# Patient Record
Sex: Female | Born: 1980 | Race: White | Hispanic: No | Marital: Married | Smoking: Never smoker
Health system: Southern US, Community
[De-identification: ages and names within clinical notes are randomized; demographics above are authoritative.]

## PROBLEM LIST (undated history)

## (undated) DIAGNOSIS — I10 Essential (primary) hypertension: Secondary | ICD-10-CM

## (undated) DIAGNOSIS — E039 Hypothyroidism, unspecified: Secondary | ICD-10-CM

## (undated) DIAGNOSIS — F419 Anxiety disorder, unspecified: Secondary | ICD-10-CM

## (undated) HISTORY — PX: CHOLECYSTECTOMY: SHX55

## (undated) HISTORY — PX: ABDOMINAL SURGERY: SHX537

## (undated) HISTORY — PX: DILATION AND CURETTAGE OF UTERUS: SHX78

---

## 2011-07-05 ENCOUNTER — Encounter: Payer: Self-pay | Admitting: *Deleted

## 2011-07-05 ENCOUNTER — Emergency Department
Admission: EM | Admit: 2011-07-05 | Discharge: 2011-07-05 | Disposition: A | Discharge status: Home / Self Care | Payer: Managed Care, Other (non HMO) | Source: Home / Self Care | Attending: Emergency Medicine | Admitting: Emergency Medicine

## 2011-07-05 DIAGNOSIS — S40269A Insect bite (nonvenomous) of unspecified shoulder, initial encounter: Secondary | ICD-10-CM

## 2011-07-05 DIAGNOSIS — W57XXXA Bitten or stung by nonvenomous insect and other nonvenomous arthropods, initial encounter: Secondary | ICD-10-CM

## 2011-07-05 HISTORY — DX: Hypothyroidism, unspecified: E03.9

## 2011-07-05 MED ORDER — DOXYCYCLINE HYCLATE 100 MG PO CAPS: 100.0000 mg | ORAL_CAPSULE | Freq: Two times a day (BID) | ORAL | Status: AC

## 2011-07-05 NOTE — ED Provider Notes (Signed)
History     CSN: [id number]  Arrival date & time 07/05/11  1600   First MD Initiated Contact with Patient 07/05/11 1607      Chief Complaint  Patient presents with  . Rash    L aarm    (Consider location/radiation/quality/duration/timing/severity/associated sxs/prior treatment) HPI  This is a 31 year old white female who presents today with a red raised rash started about 5 days ago.  She states that she was at the park but does not recall getting into anything.  She denies pain or itching, however when she presses on the area it feels sensitive.  There is no sensitivity around the lesions or anywhere else in her arms.  There are 4 bumps with heads on them on her left upper arm.  She does not recall if she got bit by anything or not.  She's been using Polysporin and Neosporin.  She feels that the redness is actually decreasing/improving and she just wants to make sure it's ok.  Past Medical History  Diagnosis Date  . Hypothyroid     Past Surgical History  Procedure Date  . Cesarean section     2  . Dilation and curettage of uterus     Family History  Problem Relation Age of Onset  . Multiple sclerosis Mother   . Hypertension Father     History  Substance Use Topics  . Smoking status: Never Smoker   . Smokeless tobacco: Not on file  . Alcohol Use: No    OB History    Grav Para Term Preterm Abortions TAB SAB Ect Mult Living                  Review of Systems  All other systems reviewed and are negative.    Allergies  Relpax and Sulfa antibiotics  Home Medications   Current Outpatient Rx  Name Route Sig Dispense Refill  . LEVOTHYROXINE SODIUM 50 MCG PO TABS Oral Take 50 mcg by mouth daily.    Marland Kitchen PRENATAL 27-0.8 MG PO TABS Oral Take 1 tablet by mouth daily.    Marland Kitchen DOXYCYCLINE HYCLATE 100 MG PO CAPS Oral Take 1 capsule (100 mg total) by mouth 2 (two) times daily. 14 capsule 0    BP 148/98  Pulse 82  Temp(Src) 98.6 F (37 C) (Oral)  Resp 16  Ht 5\' 5"   (1.651 m)  Wt 247 lb (112.038 kg)  BMI 41.10 kg/m2  SpO2 100%  Physical Exam  Nursing note and vitals reviewed. Constitutional: She is oriented to person, place, and time. She appears well-developed and well-nourished.  HENT:  Head: Normocephalic and atraumatic.  Eyes: No scleral icterus.  Neck: Neck supple.  Cardiovascular: Regular rhythm and normal heart sounds.   Pulmonary/Chest: Effort normal and breath sounds normal. No respiratory distress.  Musculoskeletal:       Arms:      There are 4 small half centimeter areas of erythema with central popped pustular heads.  There is mild subcutaneous inflammation, but no induration or fluctuance.  Minimal tenderness to palpation.  There is no hyperesthesia around that area for any rales in that dermatome which would be C5.  Neurological: She is alert and oriented to person, place, and time.  Skin: Skin is warm and dry.  Psychiatric: She has a normal mood and affect. Her speech is normal.    ED Course  Procedures (including critical care time)  Labs Reviewed - No data to display No results found.   1. Bug bite  MDM  Differential diagnosis includes spider bites, shingles, or other mild cellulitis.  She is not experiencing any skin hypersensitivity or spreading of the rash so I do not believe that is shingles.  Looks to be 4 small bites, perhaps from 8 AM to or spider.  I gave her prescription for doxycycline but do not believe that she needs at this point.  I told her to hold it and use cortisone cream to the area.  If she is getting worse, she can start antibiotics.  If she is getting much worse in the rash is spreading in a band form, then she will call back and we'll need to treat her for shingles.  Marlaine Hind, MD 07/05/11 [phone]

## 2011-07-05 NOTE — ED Notes (Signed)
Pt developed a red raised rash 6 days ago.  Denies pain and itching.  There are 4 mumps with heads on L upper arm.

## 2013-08-03 ENCOUNTER — Encounter: Payer: Self-pay | Admitting: Emergency Medicine

## 2013-08-03 ENCOUNTER — Emergency Department
Admission: EM | Admit: 2013-08-03 | Discharge: 2013-08-03 | Disposition: A | Discharge status: Home / Self Care | Payer: Managed Care, Other (non HMO) | Source: Home / Self Care | Attending: Emergency Medicine | Admitting: Emergency Medicine

## 2013-08-03 DIAGNOSIS — J039 Acute tonsillitis, unspecified: Secondary | ICD-10-CM

## 2013-08-03 LAB — POCT RAPID STREP A (OFFICE)
Rapid Strep A Screen: NEGATIVE
Rapid Strep A Screen: NEGATIVE

## 2013-08-03 MED ORDER — AMOXICILLIN-POT CLAVULANATE 875-125 MG PO TABS: 1.0000 | ORAL_TABLET | Freq: Two times a day (BID) | ORAL | Status: DC

## 2013-08-03 MED ORDER — CEFTRIAXONE SODIUM 1 G IJ SOLR
1.0000 g | INTRAMUSCULAR | Status: AC
  Administered 2013-08-03: 1 g via INTRAMUSCULAR

## 2013-08-03 NOTE — ED Provider Notes (Addendum)
CSN: [id number]     Arrival date & time 08/03/13  1816 History   None    Chief Complaint  Patient presents with  . Sore Throat  Reports severe sore throat x 4 days. Was seen in [facility] yesterday and had rapid strep negative.  HPI SORE THROAT     Severity: Severe Tried OTC meds without significant relief.  Symptoms:  + Low-grade Fever  + Severe Swollen neck glands, right greater than left No Recent Strep Exposure     No Myalgias + Headache No Rash  No Discolored Nasal Mucus No Allergy symptoms No sinus pain/pressure No itchy/red eyes + R earache. No drainage  No Drooling No Trismus  No Nausea No Vomiting No Abdominal pain No Diarrhea No Reflux symptoms  No Cough No Breathing Difficulty No Shortness of Breath No pleuritic pain No Wheezing No Hemoptysis   Past Medical History  Diagnosis Date  . Hypothyroid    Past Surgical History  Procedure Laterality Date  . Cesarean section      2  . Dilation and curettage of uterus     Family History  Problem Relation Age of Onset  . Multiple sclerosis Mother   . Hypertension Father    History  Substance Use Topics  . Smoking status: Never Smoker   . Smokeless tobacco: Not on file  . Alcohol Use: No   OB History   Grav Para Term Preterm Abortions TAB SAB Ect Mult Living                 Review of Systems  All other systems reviewed and are negative.   Allergies  Relpax and Sulfa antibiotics  Home Medications   Prior to Admission medications   Medication Sig Start Date End Date Taking? Authorizing Provider  phentermine 37.5 MG capsule Take 37.5 mg by mouth every morning.   Yes Historical Provider, MD  amoxicillin-clavulanate (AUGMENTIN) 875-125 MG per tablet Take 1 tablet by mouth 2 (two) times daily. For 10 days. Take with food. 08/03/13   Lajean Manesavid Massey, MD  levothyroxine (SYNTHROID, LEVOTHROID) 50 MCG tablet Take 50 mcg by mouth daily.    Historical Provider, MD  Prenatal Vit-Fe Fumarate-FA  (MULTIVITAMIN-PRENATAL) 27-0.8 MG TABS Take 1 tablet by mouth daily.    Historical Provider, MD   Pulse 98  Temp(Src) 97.9 F (36.6 C) (Oral)  Ht 5\' 5"  (1.651 m)  Wt 233 lb (105.688 kg)  BMI 38.77 kg/m2  SpO2 100% Physical Exam  Nursing note and vitals reviewed. Constitutional: She is oriented to person, place, and time. She appears well-developed and well-nourished.  Non-toxic appearance. She appears ill. No distress.  HENT:  Head: Normocephalic and atraumatic.  Right Ear: External ear and ear canal normal. No drainage. Tympanic membrane is erythematous (Minimal injection). Tympanic membrane is not perforated and not bulging.  Left Ear: Tympanic membrane, external ear and ear canal normal. No drainage. Tympanic membrane is not perforated, not erythematous and not bulging.  Nose: Nose normal. Right sinus exhibits no maxillary sinus tenderness and no frontal sinus tenderness. Left sinus exhibits no maxillary sinus tenderness and no frontal sinus tenderness.  Mouth/Throat: Uvula is midline and mucous membranes are normal. No oral lesions. Posterior oropharyngeal erythema present. No oropharyngeal exudate or tonsillar abscesses.  + 4 Tonsillar enlargement and redness. Whitish exudate bilaterally  Airway intact.  Eyes: Conjunctivae are normal. No scleral icterus.  Neck: Neck supple.  Cardiovascular: Normal rate, regular rhythm and normal heart sounds.   No murmur heard.  Pulmonary/Chest: Effort normal and breath sounds normal. No stridor. No respiratory distress. She has no wheezes. She has no rhonchi. She has no rales.  Abdominal: Soft. She exhibits no mass. There is no hepatosplenomegaly. There is no tenderness.  Lymphadenopathy:    She has cervical adenopathy.       Right cervical: Superficial cervical adenopathy present. No deep cervical and no posterior cervical adenopathy present.      Left cervical: Superficial cervical adenopathy present. No deep cervical and no posterior cervical  adenopathy present.  Neurological: She is alert and oriented to person, place, and time.  Skin: Skin is warm. No rash noted.  Psychiatric: She has a normal mood and affect.    ED Course  Procedures (including critical care time) Labs Review Labs Reviewed  POCT RAPID STREP A (OFFICE)   Results for orders placed during the hospital encounter of 08/03/13  POCT RAPID STREP A (OFFICE)      Result Value Ref Range   Rapid Strep A Screen Negative  Negative     Imaging Review No results found.   MDM   1. Tonsillitis with exudate    no evidence of peritonsillar abscess. Rapid strep test here is negative.  Treatment options discussed, as well as risks, benefits, alternatives. Patient voiced understanding and agreement with the following plans: Rocephin 1 g IM stat. Augmentin 875 twice a day x10 days. Sendoff strep culture. She declined any other testing. Discussed that it's much less likely mononucleus was, as she is over 30.--She declined mono testing today. Other symptomatic care discussed.  Follow-up with your primary care doctor in 3-5 days if not improving, or sooner if symptoms become worse. Precautions discussed. Red flags discussed.--Go to emergency room immediately if any red flags. Questions invited and answered. Patient voiced understanding and agreement.      Lajean Manesavid Massey, MD 08/03/13 1857  Lajean Manesavid Massey, MD 08/03/13 [phone]

## 2013-08-03 NOTE — ED Notes (Signed)
Reports severely sore throat x 4 days. Was seen in [facility] yesterday and had rapid strep negative.

## 2013-08-04 LAB — STREP A DNA PROBE: GASP: NEGATIVE

## 2013-08-05 ENCOUNTER — Telehealth: Payer: Self-pay | Admitting: Emergency Medicine

## 2013-08-19 ENCOUNTER — Emergency Department
Admission: EM | Admit: 2013-08-19 | Discharge: 2013-08-19 | Disposition: A | Discharge status: Home / Self Care | Payer: Managed Care, Other (non HMO) | Source: Home / Self Care | Attending: Emergency Medicine | Admitting: Emergency Medicine

## 2013-08-19 ENCOUNTER — Encounter: Payer: Self-pay | Admitting: Emergency Medicine

## 2013-08-19 DIAGNOSIS — J028 Acute pharyngitis due to other specified organisms: Secondary | ICD-10-CM

## 2013-08-19 DIAGNOSIS — J029 Acute pharyngitis, unspecified: Secondary | ICD-10-CM

## 2013-08-19 MED ORDER — CLARITHROMYCIN 500 MG PO TABS: 500.0000 mg | ORAL_TABLET | Freq: Two times a day (BID) | ORAL | Status: DC

## 2013-08-19 NOTE — ED Provider Notes (Signed)
CSN: [id number]     Arrival date & time 08/19/13  1729 History   First MD Initiated Contact with Patient 08/19/13 1740     Chief Complaint  Patient presents with  . Sore Throat   (Consider location/radiation/quality/duration/timing/severity/associated sxs/prior Treatment) HPI Chloe Liu is a 33 y.o. female who complains of onset of cold symptoms for a few days.  The symptoms are constant and mild-moderate in severity.  See previous note by Dr. Georgina Pillion earlier this month, however she was recently diagnosed with tonsillitis and put on 10 days of Augmentin.  Negative strep test and negative throat culture.  She improved on the antibiotics and then worsened recently again.  She didn't want to wait until she got as bad as she was before.  Has not eaten any medications or modalities for this.  Did change her toothbrush after the last course of antibiotics. + sore throat No cough No pleuritic pain No wheezing No nasal congestion No post-nasal drainage No sinus pain/pressure No chest congestion No itchy/red eyes No earache No hemoptysis No SOB No chills/sweats No fever No nausea No vomiting No abdominal pain No diarrhea No skin rashes No fatigue No myalgias No headache    Past Medical History  Diagnosis Date  . Hypothyroid    Past Surgical History  Procedure Laterality Date  . Cesarean section      2  . Dilation and curettage of uterus     Family History  Problem Relation Age of Onset  . Multiple sclerosis Mother   . Hypertension Father    History  Substance Use Topics  . Smoking status: Never Smoker   . Smokeless tobacco: Not on file  . Alcohol Use: No   OB History   Grav Para Term Preterm Abortions TAB SAB Ect Mult Living                 Review of Systems  All other systems reviewed and are negative.   Allergies  Relpax and Sulfa antibiotics  Home Medications   Prior to Admission medications   Medication Sig Start Date End Date Taking? Authorizing Provider   amoxicillin-clavulanate (AUGMENTIN) 875-125 MG per tablet Take 1 tablet by mouth 2 (two) times daily. For 10 days. Take with food. 08/03/13   Lajean Manes, MD  clarithromycin (BIAXIN) 500 MG tablet Take 1 tablet (500 mg total) by mouth 2 (two) times daily. 08/19/13   Marlaine Hind, MD  levothyroxine (SYNTHROID, LEVOTHROID) 50 MCG tablet Take 50 mcg by mouth daily.    Historical Provider, MD  phentermine 37.5 MG capsule Take 37.5 mg by mouth every morning.    Historical Provider, MD  Prenatal Vit-Fe Fumarate-FA (MULTIVITAMIN-PRENATAL) 27-0.8 MG TABS Take 1 tablet by mouth daily.    Historical Provider, MD   BP 139/90  Pulse 87  Temp(Src) 97.6 F (36.4 C) (Oral)  Resp 16  Ht 5\' 5"  (1.651 m)  Wt 231 lb (104.781 kg)  BMI 38.44 kg/m2  SpO2 98% Physical Exam  Nursing note and vitals reviewed. Constitutional: She is oriented to person, place, and time. Vital signs are normal. She appears well-developed and well-nourished.  HENT:  Head: Normocephalic and atraumatic.  Right Ear: Tympanic membrane, external ear and ear canal normal.  Left Ear: Tympanic membrane, external ear and ear canal normal.  Nose: Nose normal.  Mouth/Throat: Uvula is midline. Oropharyngeal exudate (minimal bilateral), posterior oropharyngeal edema (Tonsillar swelling 2+ bilateral) and posterior oropharyngeal erythema present.  Eyes: No scleral icterus.  Neck: Neck supple.  Cardiovascular:  Normal rate, regular rhythm and normal heart sounds.   Pulmonary/Chest: Effort normal and breath sounds normal. No respiratory distress. She has no decreased breath sounds. She has no wheezes.  Lymphadenopathy:       Head (right side): Tonsillar adenopathy present.       Head (left side): Tonsillar adenopathy present.  Neurological: She is alert and oriented to person, place, and time.  Skin: Skin is warm and dry.  Psychiatric: She has a normal mood and affect. Her speech is normal.    ED Course  Procedures (including critical  care time) Labs Review Labs Reviewed  STREP A DNA PROBE  POCT RAPID STREP A (OFFICE)    Imaging Review No results found.   MDM   1. Acute pharyngitis due to other specified organisms    1)  Take the prescribed antibiotic as instructed.  Was just on Augmentin recently so we'll switch to Biaxin for 10 days to treat for acute tonsillitis.  Rapid strep negative, culture pending. 2)  Use nasal saline solution (over the counter) at least 3 times a day. 3)  Use over the counter decongestants like Zyrtec-D every 12 hours as needed to help with congestion.  If you have hypertension, do not take medicines with sudafed.  4)  Can take tylenol every 6 hours or motrin every 8 hours for pain or fever. 5)  Follow up with your primary doctor if no improvement in 5-7 days, sooner if increasing pain, fever, or new symptoms.   If recurring symptoms after this, will likely need to be seen by ENT.  I educated her on making sure she changes her toothbrush, and washes anything that goes in her mouth to prevent self inoculation.    Marlaine HindJeffrey H Chaylee Ehrsam, MD 08/19/13 [phone]

## 2013-08-19 NOTE — ED Notes (Signed)
Reports recent onset sore throat. No known fever, no congestion or cough.

## 2013-08-20 LAB — STREP A DNA PROBE: GASP: NEGATIVE

## 2013-08-21 ENCOUNTER — Telehealth: Payer: Self-pay | Admitting: Emergency Medicine

## 2013-10-12 ENCOUNTER — Encounter: Payer: Self-pay | Admitting: Emergency Medicine

## 2013-10-12 ENCOUNTER — Emergency Department
Admission: EM | Admit: 2013-10-12 | Discharge: 2013-10-12 | Disposition: A | Discharge status: Home / Self Care | Payer: Managed Care, Other (non HMO) | Source: Home / Self Care | Attending: Emergency Medicine | Admitting: Emergency Medicine

## 2013-10-12 DIAGNOSIS — R05 Cough: Secondary | ICD-10-CM

## 2013-10-12 DIAGNOSIS — R059 Cough, unspecified: Secondary | ICD-10-CM

## 2013-10-12 MED ORDER — PREDNISONE (PAK) 10 MG PO TABS: ORAL_TABLET | Freq: Every day | ORAL | Status: DC

## 2013-10-12 MED ORDER — ALBUTEROL SULFATE HFA 108 (90 BASE) MCG/ACT IN AERS: 1.0000 | INHALATION_SPRAY | Freq: Four times a day (QID) | RESPIRATORY_TRACT | Status: AC | PRN

## 2013-10-12 NOTE — ED Provider Notes (Signed)
CSN: [id number]     Arrival date & time 10/12/13  1826 History   First MD Initiated Contact with Patient 10/12/13 1854     Chief Complaint  Patient presents with  . Cough   (Consider location/radiation/quality/duration/timing/severity/associated sxs/prior Treatment) HPI Chloe Liu is a 33 y.o. female who complains of onset of cold symptoms for 1 month.  The symptoms are constant and mild-moderate in severity.  Her main concern is the cough that she's been having for the last month.  She was treated while ago for tonsillitis but has had no problems since then.  Her daughter became sick with very similar symptoms.  An x-ray was done today for her daughter which was negative.  She was then given albuterol.  She does have reactive airway disease.  She has been also using some Tessalon which helps a little bit. No sore throat + cough No pleuritic pain No wheezing No nasal congestion No post-nasal drainage No sinus pain/pressure + chest congestion No itchy/red eyes No earache No hemoptysis No SOB No chills/sweats No fever No nausea No vomiting No abdominal pain No diarrhea No skin rashes No fatigue No myalgias No headache     Past Medical History  Diagnosis Date  . Hypothyroid    Past Surgical History  Procedure Laterality Date  . Cesarean section      2  . Dilation and curettage of uterus     Family History  Problem Relation Age of Onset  . Multiple sclerosis Mother   . Hypertension Father    History  Substance Use Topics  . Smoking status: Never Smoker   . Smokeless tobacco: Not on file  . Alcohol Use: No   OB History   Grav Para Term Preterm Abortions TAB SAB Ect Mult Living                 Review of Systems  All other systems reviewed and are negative.   Allergies  Relpax and Sulfa antibiotics  Home Medications   Prior to Admission medications   Medication Sig Start Date End Date Taking? Authorizing Provider  albuterol (PROVENTIL HFA;VENTOLIN HFA) 108  (90 BASE) MCG/ACT inhaler Inhale 1-2 puffs into the lungs every 6 (six) hours as needed for wheezing or shortness of breath. 10/12/13   Marlaine Hind, MD  amoxicillin-clavulanate (AUGMENTIN) 875-125 MG per tablet Take 1 tablet by mouth 2 (two) times daily. For 10 days. Take with food. 08/03/13   Lajean Manes, MD  clarithromycin (BIAXIN) 500 MG tablet Take 1 tablet (500 mg total) by mouth 2 (two) times daily. 08/19/13   Marlaine Hind, MD  levothyroxine (SYNTHROID, LEVOTHROID) 50 MCG tablet Take 50 mcg by mouth daily.    Historical Provider, MD  phentermine 37.5 MG capsule Take 37.5 mg by mouth every morning.    Historical Provider, MD  predniSONE (STERAPRED UNI-PAK) 10 MG tablet Take by mouth daily. 6 day pack, use as directed 10/12/13   Marlaine Hind, MD  Prenatal Vit-Fe Fumarate-FA (MULTIVITAMIN-PRENATAL) 27-0.8 MG TABS Take 1 tablet by mouth daily.    Historical Provider, MD   BP 155/85  Pulse 83  Temp(Src) 98.8 F (37.1 C) (Oral)  Resp 18  Ht  (1.651 m)  Wt 231 lb (104.781 kg)  BMI 38.44 kg/m2  SpO2 100% Physical Exam  Nursing note and vitals reviewed. Constitutional: She is oriented to person, place, and time. She appears well-developed and well-nourished.  Non-toxic appearance. She does not appear ill.  Actively coughing  HENT:  Head: Normocephalic and atraumatic.  Right Ear: Tympanic membrane, external ear and ear canal normal.  Left Ear: Tympanic membrane, external ear and ear canal normal.  Nose: Mucosal edema and rhinorrhea present.  Mouth/Throat: Posterior oropharyngeal erythema present. No oropharyngeal exudate or posterior oropharyngeal edema.  1+ tonsillar enlargement bilateral with some erythema  Eyes: No scleral icterus.  Neck: Neck supple.  Cardiovascular: Regular rhythm and normal heart sounds.   Pulmonary/Chest: Effort normal and breath sounds normal. No respiratory distress.  Neurological: She is alert and oriented to person, place, and time.   Skin: Skin is warm and dry.  Psychiatric: She has a normal mood and affect. Her speech is normal.    ED Course  Procedures (including critical care time) Labs Review Labs Reviewed - No data to display  Imaging Review No results found.   MDM   1. Cough    1)  likely reactive airway disease, viral.  We'll start her on prednisone 60 pack and albuterol inhaler.  No antibiotics at this time.  Can continue the Tessalon that she is already taking at home.  Call back for any problems or worsening of symptoms. 2)  Use nasal saline solution (over the counter) at least 3 times a day. 3)  Use over the counter decongestants like Zyrtec-D every 12 hours as needed to help with congestion.  If you have hypertension, do not take medicines with sudafed.  4)  Can take tylenol every 6 hours or motrin every 8 hours for pain or fever. 5)  Follow up with your primary doctor if no improvement in 5-7 days, sooner if increasing pain, fever, or new symptoms.     Marlaine Hind, MD 10/12/13 [phone]

## 2013-10-12 NOTE — ED Notes (Signed)
C/o cold symptoms x 1 month.

## 2015-01-30 ENCOUNTER — Emergency Department (INDEPENDENT_AMBULATORY_CARE_PROVIDER_SITE_OTHER)
Admission: EM | Admit: 2015-01-30 | Discharge: 2015-01-30 | Disposition: A | Discharge status: Home / Self Care | Payer: 59 | Source: Home / Self Care | Attending: Emergency Medicine | Admitting: Emergency Medicine

## 2015-01-30 ENCOUNTER — Encounter: Payer: Self-pay | Admitting: *Deleted

## 2015-01-30 DIAGNOSIS — J039 Acute tonsillitis, unspecified: Secondary | ICD-10-CM | POA: Diagnosis not present

## 2015-01-30 DIAGNOSIS — J069 Acute upper respiratory infection, unspecified: Secondary | ICD-10-CM | POA: Diagnosis not present

## 2015-01-30 HISTORY — DX: Anxiety disorder, unspecified: F41.9

## 2015-01-30 HISTORY — DX: Essential (primary) hypertension: I10

## 2015-01-30 LAB — POCT RAPID STREP A (OFFICE): Rapid Strep A Screen: NEGATIVE

## 2015-01-30 MED ORDER — CEFDINIR 300 MG PO CAPS: 300.0000 mg | ORAL_CAPSULE | Freq: Two times a day (BID) | ORAL | Status: DC

## 2015-01-30 NOTE — ED Notes (Signed)
Pt c/o sore throat and swollen tonsils x 2 days. Denies fever. She reports URI s/s intermittently x 1 mth.

## 2015-02-03 NOTE — ED Provider Notes (Signed)
CSN: [id number]     Arrival date & time 01/30/15  [id number] History   First MD Initiated Contact with Patient 01/30/15 0827     Chief Complaint  Patient presents with  . Sore Throat    HPI Pt c/o sore throat and swollen tonsils x 3 days, with low-grade fever. She reports URI s/s intermittently x 1 mth.         SORE THROAT Onset: 3 days    Severity: moderate-severe Tried OTC meds without significant relief.  Symptoms:  + Fever  + Swollen neck glands No Recent Strep Exposure     No Myalgias Mild, nonfocal Headache,Without radiation or neurologic symptoms No Rash  Occasional Discolored Nasal Mucus No Allergy symptoms Occasional sinus pain/pressure No itchy/red eyes No earache  No Drooling No Trismus  No Nausea No Vomiting No Abdominal pain No Diarrhea No Reflux symptoms  No Cough No Breathing Difficulty No Shortness of Breath No pleuritic pain No Wheezing No Hemoptysis    Past Medical History  Diagnosis Date  . Hypothyroid   . Anxiety   . Hypertension    Past Surgical History  Procedure Laterality Date  . Cesarean section      2  . Dilation and curettage of uterus     Family History  Problem Relation Age of Onset  . Multiple sclerosis Mother   . Depression Mother   . Hypertension Father    Social History  Substance Use Topics  . Smoking status: Never Smoker   . Smokeless tobacco: None  . Alcohol Use: No   OB History    No data available     Review of Systems  All other systems reviewed and are negative.   Allergies  Relpax and Sulfa antibiotics  Home Medications   Prior to Admission medications   Medication Sig Start Date End Date Taking? Authorizing Provider  metoprolol succinate (TOPROL-XL) 50 MG 24 hr tablet Take 75 mg by mouth daily. Take with or immediately following a meal.   Yes Historical Provider, MD  albuterol (PROVENTIL HFA;VENTOLIN HFA) 108 (90 BASE) MCG/ACT inhaler Inhale 1-2 puffs into the lungs every 6 (six) hours as  needed for wheezing or shortness of breath. 10/12/13   Marlaine HindJeffrey H Henderson, MD  cefdinir (OMNICEF) 300 MG capsule Take 1 capsule (300 mg total) by mouth 2 (two) times daily. X 10 days 01/30/15   Lajean Manesavid Massey, MD  levothyroxine (SYNTHROID, LEVOTHROID) 50 MCG tablet Take 50 mcg by mouth daily.    Historical Provider, MD   Meds Ordered and Administered this Visit  Medications - No data to display  BP 171/117 mmHg  Pulse 75  Temp(Src) 97.9 F (36.6 C) (Oral)  Resp 18  Ht 5\' 5"  (1.651 m)  Wt 251 lb (113.853 kg)  BMI 41.77 kg/m2  SpO2 97% No data found.   Physical Exam  Constitutional: She is oriented to person, place, and time. She appears well-developed and well-nourished.  Non-toxic appearance. She appears ill. No distress.  HENT:  Head: Normocephalic and atraumatic.  Right Ear: Tympanic membrane, external ear and ear canal normal.  Left Ear: Tympanic membrane, external ear and ear canal normal.  Nose: Rhinorrhea present. Right sinus exhibits no maxillary sinus tenderness and no frontal sinus tenderness. Left sinus exhibits no maxillary sinus tenderness and no frontal sinus tenderness.  Mouth/Throat: Uvula is midline and mucous membranes are normal. No oral lesions. Oropharyngeal exudate and posterior oropharyngeal erythema present. No posterior oropharyngeal edema or tonsillar abscesses.  + 2+ Bilateral Tonsillar  enlargement,Minimal White Exudate  Airway intact.  Nose with boggy turbinates and mucoid drainage  Eyes: Conjunctivae are normal. No scleral icterus.  Neck: Neck supple.  Cardiovascular: Normal rate, regular rhythm and normal heart sounds.   No murmur heard. Pulmonary/Chest: Effort normal and breath sounds normal. No stridor. No respiratory distress. She has no wheezes. She has no rhonchi. She has no rales.  Abdominal: Soft. She exhibits no mass. There is no hepatosplenomegaly. There is no tenderness.  Lymphadenopathy:    She has cervical adenopathy.       Right  cervical: Superficial cervical adenopathy present. No deep cervical and no posterior cervical adenopathy present.      Left cervical: Superficial cervical adenopathy present. No deep cervical and no posterior cervical adenopathy present.  Neurological: She is alert and oriented to person, place, and time.  Skin: Skin is warm. No rash noted.  Psychiatric: She has a normal mood and affect.  Nursing note and vitals reviewed.   ED Course  Procedures (including critical care time)  Labs Review Labs Reviewed  POCT RAPID STREP A (OFFICE)   Results for orders placed or performed during the hospital encounter of 01/30/15  POCT rapid strep A  Result Value Ref Range   Rapid Strep A Screen Negative Negative   Imaging Review No results found.   MDM   1. Acute tonsillitis, unspecified etiology   2. Acute upper respiratory infection    Treatment options discussed, as well as risks, benefits, alternatives. Patient voiced understanding and agreement with the following plans: Discharge Medication List as of 01/30/2015  9:15 AM    START taking these medications   Details  cefdinir (OMNICEF) 300 MG capsule Take 1 capsule (300 mg total) by mouth 2 (two) times daily. X 10 days, Starting 01/30/2015, Until Discontinued, Normal       Other sx care discussed. Follow-up with your primary care doctor in 5-7 days if not improving, or sooner if symptoms become worse. Precautions discussed. Red flags discussed. Questions invited and answered. Patient voiced understanding and agreement.     Lajean Manes, MD 02/03/15 [phone]

## 2015-02-07 ENCOUNTER — Emergency Department (INDEPENDENT_AMBULATORY_CARE_PROVIDER_SITE_OTHER)
Admission: EM | Admit: 2015-02-07 | Discharge: 2015-02-07 | Disposition: A | Discharge status: Home / Self Care | Payer: 59 | Source: Home / Self Care | Attending: Family Medicine | Admitting: Family Medicine

## 2015-02-07 ENCOUNTER — Encounter: Payer: Self-pay | Admitting: *Deleted

## 2015-02-07 DIAGNOSIS — R030 Elevated blood-pressure reading, without diagnosis of hypertension: Secondary | ICD-10-CM

## 2015-02-07 DIAGNOSIS — R05 Cough: Secondary | ICD-10-CM | POA: Diagnosis not present

## 2015-02-07 DIAGNOSIS — IMO0001 Reserved for inherently not codable concepts without codable children: Secondary | ICD-10-CM

## 2015-02-07 DIAGNOSIS — R053 Chronic cough: Secondary | ICD-10-CM

## 2015-02-07 MED ORDER — ALBUTEROL SULFATE HFA 108 (90 BASE) MCG/ACT IN AERS: 1.0000 | INHALATION_SPRAY | Freq: Four times a day (QID) | RESPIRATORY_TRACT | Status: AC | PRN

## 2015-02-07 MED ORDER — PREDNISONE 20 MG PO TABS: ORAL_TABLET | ORAL | Status: DC

## 2015-02-07 MED ORDER — BENZONATATE 100 MG PO CAPS: 100.0000 mg | ORAL_CAPSULE | Freq: Three times a day (TID) | ORAL | Status: DC

## 2015-02-07 MED ORDER — AZITHROMYCIN 250 MG PO TABS: 250.0000 mg | ORAL_TABLET | Freq: Every day | ORAL | Status: DC

## 2015-02-07 NOTE — Discharge Instructions (Signed)
You may take 400-600mg Ibuprofen (Motrin) every 6-8 hours for fever and pain  °Alternate with Tylenol  °You may take 500mg Tylenol every 4-6 hours as needed for fever and pain  °Follow-up with your primary care provider next week for recheck of symptoms if not improving.  °Be sure to drink plenty of fluids and rest, at least 8hrs of sleep a night, preferably more while you are sick. °Return urgent care or go to closest ER if you cannot keep down fluids/signs of dehydration, fever not reducing with Tylenol, difficulty breathing/wheezing, stiff neck, worsening condition, or other concerns (see below)  °Please take antibiotics as prescribed and be sure to complete entire course even if you start to feel better to ensure infection does not come back. ° ° °Cool Mist Vaporizers °Vaporizers may help relieve the symptoms of a cough and cold. They add moisture to the air, which helps mucus to become thinner and less sticky. This makes it easier to breathe and cough up secretions. Cool mist vaporizers do not cause serious burns like hot mist vaporizers, which may also be called steamers or humidifiers. Vaporizers have not been proven to help with colds. You should not use a vaporizer if you are allergic to mold. °HOME CARE INSTRUCTIONS °· Follow the package instructions for the vaporizer. °· Do not use anything other than distilled water in the vaporizer. °· Do not run the vaporizer all of the time. This can cause mold or bacteria to grow in the vaporizer. °· Clean the vaporizer after each time it is used. °· Clean and dry the vaporizer well before storing it. °· Stop using the vaporizer if worsening respiratory symptoms develop. °  °This information is not intended to replace advice given to you by your health care provider. Make sure you discuss any questions you have with your health care provider. °  °Document Released: 10/17/2003 Document Revised: 01/24/2013 Document Reviewed: 06/08/2012 °Elsevier Interactive Patient  Education ©2016 [employer] ° °Cough, Adult °A cough helps to clear your throat and lungs. A cough may last only 2-3 weeks (acute), or it may last longer than 8 weeks (chronic). Many different things can cause a cough. A cough may be a sign of an illness or another medical condition. °HOME CARE °· Pay attention to any changes in your cough. °· Take medicines only as told by your doctor. °¨ If you were prescribed an antibiotic medicine, take it as told by your doctor. Do not stop taking it even if you start to feel better. °¨ Talk with your doctor before you try using a cough medicine. °· Drink enough fluid to keep your pee (urine) clear or pale yellow. °· If the air is dry, use a cold steam vaporizer or humidifier in your home. °· Stay away from things that make you cough at work or at home. °· If your cough is worse at night, try using extra pillows to raise your head up higher while you sleep. °· Do not smoke, and try not to be around smoke. If you need help quitting, ask your doctor. °· Do not have caffeine. °· Do not drink alcohol. °· Rest as needed. °GET HELP IF: °· You have new problems (symptoms). °· You cough up yellow fluid (pus). °· Your cough does not get better after 2-3 weeks, or your cough gets worse. °· Medicine does not help your cough and you are not sleeping well. °· You have pain that gets worse or pain that is not helped with medicine. °·   You have a fever. °· You are losing weight and you do not know why. °· You have night sweats. °GET HELP RIGHT AWAY IF: °· You cough up blood. °· You have trouble breathing. °· Your heartbeat is very fast. °  °This information is not intended to replace advice given to you by your health care provider. Make sure you discuss any questions you have with your health care provider. °  °Document Released: 10/02/2010 Document Revised: 10/10/2014 Document Reviewed: 03/28/2014 °Elsevier Interactive Patient Education ©2016 [employer]. ° °

## 2015-02-07 NOTE — ED Notes (Signed)
Pt c/o cough and congestion x1 week.  

## 2015-02-07 NOTE — ED Provider Notes (Signed)
CSN: [id number]     Arrival date & time 02/07/15  1226 History   First MD Initiated Contact with Patient 02/07/15 1250     Chief Complaint  Patient presents with  . Cough   (Consider location/radiation/quality/duration/timing/severity/associated sxs/prior Treatment) HPI  Pt is a 35yo female presenting [facility] with c/o worsening cough for 1 week with congestion.  Pt was seen on 01/30/15 for acute tonsillitis and is on day 8 or 9 of her antibiotic- cefdinir but states her cough is worsening and it keeps her up at night. When she was seen on 01/30/15, she had reported intermittent URI symptoms for about 1 month.  Cough causes near post-tussive vomiting.  No prior hx of asthma.  She has been trying OTC cough medication without relief.  She has had an inhaler in the past but does not have one right now that she is using.  Denies fever, chills, n/v/d.  Pt noted to have elevated BP in triage. Hx of HTN. She did not take her BP medication last night as she is out of her medication and has f/u with her PCP next week.  Denies chest pain but does have mild SOB when she gets into a coughing fit.   Past Medical History  Diagnosis Date  . Hypothyroid   . Anxiety   . Hypertension    Past Surgical History  Procedure Laterality Date  . Cesarean section      2  . Dilation and curettage of uterus     Family History  Problem Relation Age of Onset  . Multiple sclerosis Mother   . Depression Mother   . Hypertension Father    Social History  Substance Use Topics  . Smoking status: Never Smoker   . Smokeless tobacco: None  . Alcohol Use: No   OB History    No data available     Review of Systems  Constitutional: Negative for fever and chills.  HENT: Positive for congestion. Negative for ear pain, sore throat, trouble swallowing and voice change.   Respiratory: Positive for cough and shortness of breath.   Cardiovascular: Negative for chest pain and palpitations.  Gastrointestinal: Negative for  nausea, vomiting, abdominal pain and diarrhea.  Musculoskeletal: Negative for myalgias, back pain and arthralgias.  Skin: Negative for rash.  Neurological: Positive for headaches. Negative for dizziness and light-headedness.    Allergies  Relpax and Sulfa antibiotics  Home Medications   Prior to Admission medications   Medication Sig Start Date End Date Taking? Authorizing Provider  albuterol (PROVENTIL HFA;VENTOLIN HFA) 108 (90 BASE) MCG/ACT inhaler Inhale 1-2 puffs into the lungs every 6 (six) hours as needed for wheezing or shortness of breath. 10/12/13   Marlaine HindJeffrey H Henderson, MD  albuterol (PROVENTIL HFA;VENTOLIN HFA) 108 (90 Base) MCG/ACT inhaler Inhale 1-2 puffs into the lungs every 6 (six) hours as needed for wheezing or shortness of breath. 02/07/15   Junius FinnerErin O'Malley, PA-C  azithromycin (ZITHROMAX) 250 MG tablet Take 1 tablet (250 mg total) by mouth daily. Take first 2 tablets together, then 1 every day until finished. 02/07/15   Junius FinnerErin O'Malley, PA-C  benzonatate (TESSALON) 100 MG capsule Take 1 capsule (100 mg total) by mouth every 8 (eight) hours. 02/07/15   Junius FinnerErin O'Malley, PA-C  levothyroxine (SYNTHROID, LEVOTHROID) 50 MCG tablet Take 50 mcg by mouth daily.    Historical Provider, MD  metoprolol succinate (TOPROL-XL) 50 MG 24 hr tablet Take 75 mg by mouth daily. Take with or immediately following a meal.  Historical Provider, MD  predniSONE (DELTASONE) 20 MG tablet 3 tabs po day one, then 2 po daily x 4 days 02/07/15   Junius Finner, PA-C   Meds Ordered and Administered this Visit  Medications - No data to display  BP 161/117 mmHg  Pulse 88  Temp(Src) 98.3 F (36.8 C) (Oral)  Resp 16  Wt 248 lb (112.492 kg)  SpO2 99% No data found.   Physical Exam  Constitutional: She appears well-developed and well-nourished. No distress.  HENT:  Head: Normocephalic and atraumatic.  Right Ear: Hearing, tympanic membrane, external ear and ear canal normal.  Left Ear: Hearing, tympanic membrane,  external ear and ear canal normal.  Nose: Nose normal.  Mouth/Throat: Uvula is midline and mucous membranes are normal. Posterior oropharyngeal erythema present. No oropharyngeal exudate, posterior oropharyngeal edema or tonsillar abscesses.  Eyes: Conjunctivae are normal. No scleral icterus.  Neck: Normal range of motion. Neck supple.  Cardiovascular: Normal rate, regular rhythm and normal heart sounds.   Pulmonary/Chest: Effort normal and breath sounds normal. No respiratory distress. She has no wheezes. She has no rales. She exhibits no tenderness.  Dry hacking cough throughout exam. Able to speak in full sentences between coughing fits. Lungs: CTAB  Abdominal: Soft. Bowel sounds are normal. She exhibits no distension and no mass. There is no tenderness. There is no rebound and no guarding.  Musculoskeletal: Normal range of motion.  Neurological: She is alert.  Skin: Skin is warm and dry. She is not diaphoretic.  Nursing note and vitals reviewed.   ED Course  Procedures (including critical care time)  Labs Review Labs Reviewed - No data to display  Imaging Review No results found.     MDM   1. Persistent cough for 3 weeks or longer   2. Elevated blood pressure    Pt c/o persistent cough despite being on day 9 of Cefdinir that was prescribed for tonsillitis.   O2 Sat 99% on RA. Pt denies chest pain. Doubt PE or ACS.    Pt does have elevated BP. She is out of her BP medication and f/u with PCP next week.  Initial BP- 169/127 (RN noted this was during a coughing fit).  Repeat BP- 161/117.  Similar to BP last week when pt seen at [facility]  Will tx with Azithromycin to cover atypical bacteria. Prednisone, albuterol, and tessalon. Strongly encouraged pt f/u with PCP next week as previously scheduled for BP recheck and change in BP medication if needed.  Also f/u next week if cough not improving.  Patient verbalized understanding and agreement with treatment plan.     Junius Finner, PA-C 02/07/15 1623

## 2016-01-08 ENCOUNTER — Emergency Department (INDEPENDENT_AMBULATORY_CARE_PROVIDER_SITE_OTHER)
Admission: EM | Admit: 2016-01-08 | Discharge: 2016-01-08 | Disposition: A | Discharge status: Home / Self Care | Payer: 59 | Source: Home / Self Care | Attending: Family Medicine | Admitting: Family Medicine

## 2016-01-08 ENCOUNTER — Encounter: Payer: Self-pay | Admitting: *Deleted

## 2016-01-08 DIAGNOSIS — M542 Cervicalgia: Secondary | ICD-10-CM

## 2016-01-08 DIAGNOSIS — M62838 Other muscle spasm: Secondary | ICD-10-CM

## 2016-01-08 MED ORDER — TRAMADOL HCL 50 MG PO TABS: 50.0000 mg | ORAL_TABLET | Freq: Four times a day (QID) | ORAL | 0 refills | Status: AC | PRN

## 2016-01-08 MED ORDER — CYCLOBENZAPRINE HCL 10 MG PO TABS: 10.0000 mg | ORAL_TABLET | Freq: Two times a day (BID) | ORAL | 0 refills | Status: DC | PRN

## 2016-01-08 NOTE — Discharge Instructions (Signed)
°  Tramadol is strong pain medication. While taking, do not drink alcohol, drive, or perform any other activities that requires focus while taking these medications.   Flexeril is a muscle relaxer and may cause drowsiness. Do not drink alcohol, drive, or operate heavy machinery while taking.  You may take over the counter antiinflammatory medications such as ibuprofen (Motrin or Advil) 600mg  every 6-8 hours for pain.

## 2016-01-08 NOTE — ED Triage Notes (Signed)
Patient c/o left sided neck and shoulder pain x 5 days. She had slept propped up on a pillow d/t URI x 2 weeks. Taken Aleve, Advil, tylenol, icy hot and hot and cold compresses.

## 2016-01-08 NOTE — ED Provider Notes (Signed)
CSN: [id number]     Arrival date & time 01/08/16  0911 History   First MD Initiated Contact with Patient 01/08/16 [phone]     Chief Complaint  Patient presents with  . Neck Pain   (Consider location/radiation/quality/duration/timing/severity/associated sxs/prior Treatment) HPI Chloe Liu is a 35 y.o. female presenting to UC with c/o Left sided neck and upper back pain that started about 5 days ago. Pt notes she has had URI symptoms for about 2 weeks so she had been sleeping propped up but woke one morning with pain and stiffness. Pain is aching and sore, 8/10, worse with movement of her Left arm.  She has tried Aleve, Advil, Tylenol, icy hot, and heat and ice w/o much relief.  Reports hx of muscle spasm in lower back many years ago but no ongoing neck or back problems. Denies heavy lifting or falls.   BP elevated to 172/126. BP was checked several times. Hx of HTN. She did take her BP medication this morning. She notes she believes it is due to the severe pain she is in from her back/neck. Denies chest pain or SOB. Denies headache, dizziness, or change in vision. Pt has routine f/u with PCP in January (next month).  Past Medical History:  Diagnosis Date  . Anxiety   . Hypertension   . Hypothyroid    Past Surgical History:  Procedure Laterality Date  . CESAREAN SECTION     2  . DILATION AND CURETTAGE OF UTERUS     Family History  Problem Relation Age of Onset  . Multiple sclerosis Mother   . Depression Mother   . Hypertension Father    Social History  Substance Use Topics  . Smoking status: Never Smoker  . Smokeless tobacco: Never Used  . Alcohol use No   OB History    No data available     Review of Systems  Constitutional: Negative for chills and fever.  Musculoskeletal: Positive for back pain, myalgias, neck pain and neck stiffness. Negative for arthralgias, gait problem and joint swelling.  Skin: Negative for color change, pallor, rash and wound.  Neurological: Negative  for weakness and numbness.    Allergies  Relpax [eletriptan] and Sulfa antibiotics  Home Medications   Prior to Admission medications   Medication Sig Start Date End Date Taking? Authorizing Provider  albuterol (PROVENTIL HFA;VENTOLIN HFA) 108 (90 BASE) MCG/ACT inhaler Inhale 1-2 puffs into the lungs every 6 (six) hours as needed for wheezing or shortness of breath. 10/12/13   Marlaine HindJeffrey H Henderson, MD  albuterol (PROVENTIL HFA;VENTOLIN HFA) 108 (90 Base) MCG/ACT inhaler Inhale 1-2 puffs into the lungs every 6 (six) hours as needed for wheezing or shortness of breath. 02/07/15   Junius FinnerErin O'Malley, PA-C  cyclobenzaprine (FLEXERIL) 10 MG tablet Take 1 tablet (10 mg total) by mouth 2 (two) times daily as needed. 01/08/16   Junius FinnerErin O'Malley, PA-C  levothyroxine (SYNTHROID, LEVOTHROID) 50 MCG tablet Take 50 mcg by mouth daily.    Historical Provider, MD  metoprolol succinate (TOPROL-XL) 50 MG 24 hr tablet Take 75 mg by mouth daily. Take with or immediately following a meal.    Historical Provider, MD  traMADol (ULTRAM) 50 MG tablet Take 1 tablet (50 mg total) by mouth every 6 (six) hours as needed. 01/08/16   Junius FinnerErin O'Malley, PA-C   Meds Ordered and Administered this Visit  Medications - No data to display  BP (!) 170/126 (BP Location: Right Arm)   Pulse 95   SpO2 98%  No  data found.   Physical Exam  Constitutional: She is oriented to person, place, and time. She appears well-developed and well-nourished. No distress.  HENT:  Head: Normocephalic and atraumatic.  Eyes: EOM are normal.  Neck: Normal range of motion. Neck supple.  No midline bone tenderness, no crepitus or step-offs.  Cardiovascular: Normal rate.   Pulses:      Radial pulses are 2+ on the left side.  Pulmonary/Chest: Effort normal.  Musculoskeletal: She exhibits tenderness. She exhibits no edema.  No midline spinal tenderness. Tenderness with palpable muscle spasm in Left upper trapezius.  Full adduction of Left arm but limited  abduction due to pain.  5/5 grip strength bilaterally.   Neurological: She is alert and oriented to person, place, and time.  Skin: Skin is warm and dry. Capillary refill takes less than 2 seconds. She is not diaphoretic. No erythema.  Psychiatric: She has a normal mood and affect. Her behavior is normal.  Nursing note and vitals reviewed.   Urgent Care Course   Clinical Course     Procedures (including critical care time)  Labs Review Labs Reviewed - No data to display  Imaging Review No results found.    MDM   1. Neck pain on left side   2. Muscle spasms of neck   3. Trapezius muscle spasm    Pt c/o Left upper back and neck pain with stiffness for about 5 days after sleeping propped up.  Palpable muscle spasm in Left upper trapezius on exam.  Rx: tramadol and flexeril Home care instructions provided. May have 600mg  ibuprofen every 6-8 hours. F/u with PCP next month for routine physical for recheck of BP and ongoing healthcare needs.    Junius FinnerErin O'Malley, PA-C 01/08/16 [phone]

## 2016-04-10 ENCOUNTER — Encounter: Payer: Self-pay | Admitting: *Deleted

## 2016-04-10 ENCOUNTER — Emergency Department
Admission: EM | Admit: 2016-04-10 | Discharge: 2016-04-10 | Disposition: A | Discharge status: Home / Self Care | Payer: Managed Care, Other (non HMO) | Source: Home / Self Care | Attending: Family Medicine | Admitting: Family Medicine

## 2016-04-10 DIAGNOSIS — R030 Elevated blood-pressure reading, without diagnosis of hypertension: Secondary | ICD-10-CM

## 2016-04-10 DIAGNOSIS — J039 Acute tonsillitis, unspecified: Secondary | ICD-10-CM | POA: Diagnosis not present

## 2016-04-10 LAB — POCT RAPID STREP A (OFFICE): Rapid Strep A Screen: NEGATIVE

## 2016-04-10 MED ORDER — PREDNISONE 20 MG PO TABS: ORAL_TABLET | ORAL | 0 refills | Status: DC

## 2016-04-10 MED ORDER — METHYLPREDNISOLONE SODIUM SUCC 40 MG IJ SOLR
80.0000 mg | Freq: Once | INTRAMUSCULAR | Status: AC
  Administered 2016-04-10: 80 mg via INTRAMUSCULAR

## 2016-04-10 NOTE — ED Triage Notes (Signed)
Pt c/o sore throat x last night. Hx of tonsillitis. Denies fever.

## 2016-04-10 NOTE — ED Provider Notes (Signed)
CSN: [id number]     Arrival date & time 04/10/16  1743 History   First MD Initiated Contact with Patient 04/10/16 1819     Chief Complaint  Patient presents with  . Sore Throat   (Consider location/radiation/quality/duration/timing/severity/associated sxs/prior Treatment) HPI  Chloe Liu is a 36 y.o. female presenting to UC with c/o sore throat that started last night. Hx of recurrent tonsillitis. Last one was about 1 year ago.  Pain is aching and sore, mild to moderate, worse with swallowing. She notes she has done well on prednisone in the past. She was advised by her PCP last time she had similar symptoms, she may need to see ENT if it occurred again Denies fever, chills, n/v/d.    Past Medical History:  Diagnosis Date  . Anxiety   . Hypertension   . Hypothyroid    Past Surgical History:  Procedure Laterality Date  . CESAREAN SECTION     2  . DILATION AND CURETTAGE OF UTERUS     Family History  Problem Relation Age of Onset  . Multiple sclerosis Mother   . Depression Mother   . Hypertension Father    Social History  Substance Use Topics  . Smoking status: Never Smoker  . Smokeless tobacco: Never Used  . Alcohol use No   OB History    No data available     Review of Systems  Constitutional: Negative for chills and fever.  HENT: Positive for sore throat and trouble swallowing ( increased pain). Negative for congestion, ear pain and voice change.   Respiratory: Negative for cough and shortness of breath.   Cardiovascular: Negative for chest pain and palpitations.  Gastrointestinal: Negative for abdominal pain, diarrhea, nausea and vomiting.  Musculoskeletal: Negative for arthralgias, back pain and myalgias.  Skin: Negative for rash.    Allergies  Relpax [eletriptan] and Sulfa antibiotics  Home Medications   Prior to Admission medications   Medication Sig Start Date End Date Taking? Authorizing Provider  levothyroxine (SYNTHROID, LEVOTHROID) 50 MCG tablet Take  50 mcg by mouth daily.   Yes Historical Provider, MD  metoprolol succinate (TOPROL-XL) 50 MG 24 hr tablet Take 75 mg by mouth daily. Take with or immediately following a meal.   Yes Historical Provider, MD  Naltrexone-Bupropion HCl (CONTRAVE PO) Take by mouth.   Yes Historical Provider, MD  albuterol (PROVENTIL HFA;VENTOLIN HFA) 108 (90 BASE) MCG/ACT inhaler Inhale 1-2 puffs into the lungs every 6 (six) hours as needed for wheezing or shortness of breath. 10/12/13   Marlaine HindJeffrey H Henderson, MD  albuterol (PROVENTIL HFA;VENTOLIN HFA) 108 (90 Base) MCG/ACT inhaler Inhale 1-2 puffs into the lungs every 6 (six) hours as needed for wheezing or shortness of breath. 02/07/15   Junius FinnerErin O'Malley, PA-C  predniSONE (DELTASONE) 20 MG tablet 3 tabs po day one, then 2 po daily x 4 days 04/10/16   Junius FinnerErin O'Malley, PA-C  traMADol (ULTRAM) 50 MG tablet Take 1 tablet (50 mg total) by mouth every 6 (six) hours as needed. 01/08/16   Junius FinnerErin O'Malley, PA-C   Meds Ordered and Administered this Visit   Medications  methylPREDNISolone sodium succinate (SOLU-MEDROL) 40 mg/mL injection 80 mg (80 mg Intramuscular Given 04/10/16 1836)    BP 154/89 (BP Location: Left Arm)   Pulse 73   Temp 98.5 F (36.9 C) (Oral)   Resp 16   Wt 240 lb (108.9 kg)   SpO2 98%   BMI 39.94 kg/m  No data found.   Physical Exam  Constitutional: She  is oriented to person, place, and time. She appears well-developed and well-nourished. No distress.  HENT:  Head: Normocephalic and atraumatic.  Right Ear: Tympanic membrane normal.  Left Ear: Tympanic membrane normal.  Nose: Nose normal.  Mouth/Throat: Uvula is midline and mucous membranes are normal. Posterior oropharyngeal edema and posterior oropharyngeal erythema present. No oropharyngeal exudate or tonsillar abscesses.  Eyes: EOM are normal.  Neck: Normal range of motion.  Cardiovascular: Normal rate and regular rhythm.   Pulmonary/Chest: Effort normal and breath sounds normal. No respiratory  distress. She has no wheezes. She has no rales.  Musculoskeletal: Normal range of motion.  Neurological: She is alert and oriented to person, place, and time.  Skin: Skin is warm and dry. She is not diaphoretic.  Psychiatric: She has a normal mood and affect. Her behavior is normal.  Nursing note and vitals reviewed.   Urgent Care Course     Procedures (including critical care time)  Labs Review Labs Reviewed  STREP A DNA PROBE  POCT RAPID STREP A (OFFICE)    Imaging Review No results found.   MDM   1. Tonsillitis   2. Elevated blood pressure reading    Rapid strep: Negative Culture sent  BP elevated in triage. Pt denies HA or chest pain.   BP did improve after recheck from 179/110 to 154/89.    Solumedrol 80mg  IM tonight Rx: prednisone  F/u with PCP in 1 week if not improving and f/u with ENT for recurrent tonsillitis.    Junius Finner, PA-C 04/10/16 1910

## 2016-04-11 ENCOUNTER — Telehealth: Payer: Self-pay | Admitting: Emergency Medicine

## 2016-04-11 LAB — STREP A DNA PROBE: GASP: NOT DETECTED

## 2016-11-07 ENCOUNTER — Emergency Department
Admission: EM | Admit: 2016-11-07 | Discharge: 2016-11-07 | Disposition: A | Discharge status: Home / Self Care | Payer: Managed Care, Other (non HMO) | Source: Home / Self Care | Attending: Family Medicine | Admitting: Family Medicine

## 2016-11-07 ENCOUNTER — Encounter: Payer: Self-pay | Admitting: Emergency Medicine

## 2016-11-07 DIAGNOSIS — J040 Acute laryngitis: Secondary | ICD-10-CM | POA: Diagnosis not present

## 2016-11-07 LAB — POCT RAPID STREP A (OFFICE): RAPID STREP A SCREEN: NEGATIVE

## 2016-11-07 MED ORDER — PREDNISONE 20 MG PO TABS: ORAL_TABLET | ORAL | 0 refills | Status: DC

## 2016-11-07 NOTE — ED Provider Notes (Signed)
[facility]    CSN: [id number] Arrival date & time: 11/07/16  1712     History   Chief Complaint Chief Complaint  Patient presents with  . Sore Throat  . Laryngitis    HPI Chloe Liu is a 35 y.o. female.   HPI  Chloe Liu is a 36 y.o. female presenting to UC with c/o gradually worsening sore throat over the last 4 days with associated loss of her voice.  Hx of similar symptoms in the past. She took ibuprofen at noon today.  She has done well with prednisone for laryngitis in the past.  Denies cough, congestion, fever, chills, n/v/d.    BP elevated. Hx of HTN, she is taking her medications as prescribed. Pt also reports coming directly to [facility] from a funeral and is stressed but denies chest pain, SOB or HA.   Past Medical History:  Diagnosis Date  . Anxiety   . Hypertension   . Hypothyroid     There are no active problems to display for this patient.   Past Surgical History:  Procedure Laterality Date  . CESAREAN SECTION     2  . DILATION AND CURETTAGE OF UTERUS      OB History    No data available       Home Medications    Prior to Admission medications   Medication Sig Start Date End Date Taking? Authorizing Provider  hydrochlorothiazide (HYDRODIURIL) 25 MG tablet Take 25 mg by mouth daily.   Yes [provider]  albuterol (PROVENTIL HFA;VENTOLIN HFA) 108 (90 BASE) MCG/ACT inhaler Inhale 1-2 puffs into the lungs every 6 (six) hours as needed for wheezing or shortness of breath. 10/12/13   Marlaine Hind, MD  albuterol (PROVENTIL HFA;VENTOLIN HFA) 108 (90 Base) MCG/ACT inhaler Inhale 1-2 puffs into the lungs every 6 (six) hours as needed for wheezing or shortness of breath. 02/07/15   Lurene Shadow, PA-C  levothyroxine (SYNTHROID, LEVOTHROID) 50 MCG tablet Take 50 mcg by mouth daily.    [provider]  metoprolol succinate (TOPROL-XL) 50 MG 24 hr tablet Take 75 mg by mouth daily. Take with or immediately following a meal.     [provider]  Naltrexone-Bupropion HCl (CONTRAVE PO) Take by mouth.    [provider]  predniSONE (DELTASONE) 20 MG tablet 3 tabs po day one, then 2 po daily x 4 days 11/07/16   Lurene Shadow, PA-C  traMADol (ULTRAM) 50 MG tablet Take 1 tablet (50 mg total) by mouth every 6 (six) hours as needed. 01/08/16   Lurene Shadow, PA-C    Family History Family History  Problem Relation Age of Onset  . Multiple sclerosis Mother   . Depression Mother   . Hypertension Father     Social History Social History  Substance Use Topics  . Smoking status: Never Smoker  . Smokeless tobacco: Never Used  . Alcohol use No     Allergies   Relpax [eletriptan] and Sulfa antibiotics   Review of Systems Review of Systems  Constitutional: Negative for chills and fever.  HENT: Positive for congestion (minimal), sore throat and voice change. Negative for ear pain and trouble swallowing.   Respiratory: Negative for cough and shortness of breath.   Cardiovascular: Negative for chest pain and palpitations.  Gastrointestinal: Negative for abdominal pain, diarrhea, nausea and vomiting.  Musculoskeletal: Negative for arthralgias, back pain and myalgias.  Skin: Negative for rash.     Physical Exam Triage Vital Signs  ED Triage Vitals  Enc Vitals Group     BP 11/07/16 1740 (!) 156/100     Pulse Rate 11/07/16 1740 84     Resp 11/07/16 1740 16     Temp 11/07/16 1740 98.9 F (37.2 C)     Temp Source 11/07/16 1740 Oral     SpO2 11/07/16 1740 98 %     Weight 11/07/16 1741 235 lb (106.6 kg)     Height 11/07/16 1741  (1.651 m)     Head Circumference --      Peak Flow --      Pain Score --      Pain Loc --      Pain Edu? --      Excl. in GC? --    No data found.   Updated Vital Signs BP (!) 156/100 (BP Location: Left Arm)   Pulse 84   Temp 98.9 F (37.2 C) (Oral)   Resp 16   Ht  (1.651 m)   Wt 235 lb (106.6 kg)   SpO2 98%   BMI 39.11 kg/m   Visual  Acuity Right Eye Distance:   Left Eye Distance:   Bilateral Distance:    Right Eye Near:   Left Eye Near:    Bilateral Near:     Physical Exam  Constitutional: She is oriented to person, place, and time. She appears well-developed and well-nourished. No distress.  HENT:  Head: Normocephalic and atraumatic.  Right Ear: Tympanic membrane normal.  Left Ear: Tympanic membrane normal.  Nose: Nose normal.  Mouth/Throat: Uvula is midline and mucous membranes are normal. Posterior oropharyngeal erythema present. No oropharyngeal exudate, posterior oropharyngeal edema or tonsillar abscesses.  Eyes: EOM are normal.  Neck: Normal range of motion. Neck supple.  Hoarse voice but no stridor  Cardiovascular: Normal rate and regular rhythm.   Pulmonary/Chest: Effort normal and breath sounds normal. No stridor. No respiratory distress. She has no wheezes. She has no rales.  Musculoskeletal: Normal range of motion.  Lymphadenopathy:    She has no cervical adenopathy.  Neurological: She is alert and oriented to person, place, and time.  Skin: Skin is warm and dry. She is not diaphoretic.  Psychiatric: She has a normal mood and affect. Her behavior is normal.  Nursing note and vitals reviewed.    UC Treatments / Results  Labs (all labs ordered are listed, but only abnormal results are displayed) Labs Reviewed  POCT RAPID STREP A (OFFICE)    EKG  EKG Interpretation None       Radiology No results found.  Procedures Procedures (including critical care time)  Medications Ordered in UC Medications - No data to display   Initial Impression / Assessment and Plan / UC Course  I have reviewed the triage vital signs and the nursing notes.  Pertinent labs & imaging results that were available during my care of the patient were reviewed by me and considered in my medical decision making (see chart for details).     Hx and exam c/w laryngitis Encouraged symptomatic treatment May  start prescribed prednisone to help with symptoms F/u with PCP in 1 week if not improving.    Final Clinical Impressions(s) / UC Diagnoses   Final diagnoses:  Laryngitis    New Prescriptions Discharge Medication List as of 11/07/2016  6:03 PM       Controlled Substance Prescriptions [place] Controlled Substance Registry consulted? Not Applicable   Rolla Plate 11/08/16 1116

## 2016-11-07 NOTE — Discharge Instructions (Signed)
°  You may take 500mg acetaminophen every 4-6 hours or in combination with ibuprofen 400-600mg every 6-8 hours as needed for pain, inflammation, and fever. ° °Be sure to drink at least eight 8oz glasses of water to stay well hydrated and get at least 8 hours of sleep at night, preferably more while sick.  ° °

## 2016-11-07 NOTE — ED Triage Notes (Signed)
Patient reports progressive worsening of sore throat over past 4 days; now can barely speak. Took ibuprofen at noon. Is stressed having just come from funeral.

## 2020-05-06 ENCOUNTER — Other Ambulatory Visit: Payer: Self-pay

## 2020-05-06 ENCOUNTER — Emergency Department (INDEPENDENT_AMBULATORY_CARE_PROVIDER_SITE_OTHER): Payer: BC Managed Care – PPO

## 2020-05-06 ENCOUNTER — Emergency Department
Admission: RE | Admit: 2020-05-06 | Discharge: 2020-05-06 | Disposition: A | Discharge status: Home / Self Care | Payer: BC Managed Care – PPO | Source: Ambulatory Visit

## 2020-05-06 VITALS — BP 145/110 | HR 72 | Temp 98.0°F | Resp 17 | Ht 65.0 in | Wt 180.0 lb

## 2020-05-06 DIAGNOSIS — S6991XA Unspecified injury of right wrist, hand and finger(s), initial encounter: Secondary | ICD-10-CM

## 2020-05-06 DIAGNOSIS — I10 Essential (primary) hypertension: Secondary | ICD-10-CM

## 2020-05-06 DIAGNOSIS — S63276A Dislocation of unspecified interphalangeal joint of right little finger, initial encounter: Secondary | ICD-10-CM | POA: Diagnosis not present

## 2020-05-06 NOTE — ED Triage Notes (Signed)
Pt st she slipped and fell backwards at 8:30 am  and when she looked and her hand her pinky was bent at a 90 degree angle and she bent it back in to place, unable to bend pinky finger on R hand.

## 2020-05-06 NOTE — ED Provider Notes (Signed)
[facility]    CSN: [id number] Arrival date & time: 05/06/20  1033      History   Chief Complaint Chief Complaint  Patient presents with  . Finger Injury    HPI Chloe Liu is a 40 y.o. female.   Patient presents with concerns of right pinkie injury. She states earlier today, around 8:30am, she was going down a couple steps at her home and slipped, falling backwards and catching herself with her hands. After the fall she had right pinkie pain and noticed that her finger was at a 90 degree angle at one of the joints. Without really thinking about it she pulled it back into place. She continues to have pain, swelling, and bruising at that joint and is unable to bend her finger normally. She has not take or tried anything for it. She states she was planning to just put on a splint but her husband urged her to get an x-ray. The patient is left-handed. She denies prior fracture or injury to the area. She denies numbness or tingling in the right pinkie.   The history is provided by the patient.  Hand Pain This is a new problem. The current episode started 1 to 2 hours ago. The problem occurs constantly. The symptoms are aggravated by bending. She has tried nothing for the symptoms.    Past Medical History:  Diagnosis Date  . Anxiety   . Hypertension   . Hypothyroid     There are no problems to display for this patient.   Past Surgical History:  Procedure Laterality Date  . ABDOMINAL SURGERY    . CESAREAN SECTION     2  . CHOLECYSTECTOMY    . DILATION AND CURETTAGE OF UTERUS      OB History    Gravida  1   Para      Term      Preterm      AB      Living        SAB      IAB      Ectopic      Multiple      Live Births               Home Medications    Prior to Admission medications   Medication Sig Start Date End Date Taking? Authorizing Provider  ALPRAZolam Prudy Feeler) 0.5 MG tablet Take 0.5 mg by mouth once. 02/29/20  Yes [provider]  cefdinir (OMNICEF) 300 MG capsule Take by mouth. 05/06/20 05/16/20 Yes [provider]  furosemide (LASIX) 20 MG tablet Take by mouth. 02/29/20 02/28/21 Yes [provider]  phentermine (ADIPEX-P) 37.5 MG tablet phentermine 37.5 mg tablet  TAKE ONE TABLET (37.5 MG DOSE) BY MOUTH 30 (THIRTY) MINUTES BEFORE BREAKFAST FOR 30 DAYS. 07/16/13  Yes [provider]  predniSONE (DELTASONE) 20 MG tablet Take by mouth. 05/06/20 05/11/20 Yes [provider]  SUMAtriptan (IMITREX) 50 MG tablet Take by mouth. 04/29/20  Yes [provider]  venlafaxine XR (EFFEXOR-XR) 37.5 MG 24 hr capsule Take by mouth. 04/29/20  Yes [provider]  albuterol (PROVENTIL HFA;VENTOLIN HFA) 108 (90 BASE) MCG/ACT inhaler Inhale 1-2 puffs into the lungs every 6 (six) hours as needed for wheezing or shortness of breath. 10/12/13   Marlaine Hind, MD  albuterol (PROVENTIL HFA;VENTOLIN HFA) 108 (90 Base) MCG/ACT inhaler Inhale 1-2 puffs into the lungs every 6 (six) hours as needed for wheezing or shortness of breath. 02/07/15  Lurene Shadow, PA-C  Calcium Carbonate-Vit D-Min (CALCIUM 1200) 1200-1000 MG-UNIT CHEW Chew by mouth.    [provider]  hydrochlorothiazide (HYDRODIURIL) 25 MG tablet Take 25 mg by mouth daily.    [provider]  levothyroxine (SYNTHROID, LEVOTHROID) 50 MCG tablet Take 50 mcg by mouth daily.    [provider]  losartan (COZAAR) 25 MG tablet 50 mg.    [provider]  metoprolol succinate (TOPROL-XL) 50 MG 24 hr tablet Take 75 mg by mouth daily. Take with or immediately following a meal.    [provider]  Multiple Vitamin (MULTI-VITAMIN) tablet Take 1 tablet by mouth daily.    [provider]  Naltrexone-Bupropion HCl (CONTRAVE PO) Take by mouth.    [provider]  predniSONE (DELTASONE) 20 MG tablet 3 tabs po day one, then 2 po daily x 4 days 11/07/16   Lurene Shadow, PA-C  traMADol  (ULTRAM) 50 MG tablet Take 1 tablet (50 mg total) by mouth every 6 (six) hours as needed. 01/08/16   Lurene Shadow, PA-C    Family History Family History  Problem Relation Age of Onset  . Multiple sclerosis Mother   . Depression Mother   . Hypertension Father   . Cancer Father     Social History Social History   Tobacco Use  . Smoking status: Never Smoker  . Smokeless tobacco: Never Used  Substance Use Topics  . Alcohol use: No  . Drug use: No     Allergies   Relpax [eletriptan] and Sulfa antibiotics   Review of Systems Review of Systems  Constitutional: Negative for fever.  Musculoskeletal: Positive for arthralgias and joint swelling.  Skin: Positive for color change. Negative for wound.  Neurological: Negative for weakness and numbness.     Physical Exam Triage Vital Signs ED Triage Vitals  Enc Vitals Group     BP 05/06/20 1052 (!) 154/112     Pulse Rate 05/06/20 1052 72     Resp 05/06/20 1052 17     Temp 05/06/20 1052 98 F (36.7 C)     Temp Source 05/06/20 1052 Oral     SpO2 05/06/20 1052 98 %     Weight 05/06/20 1045 180 lb (81.6 kg)     Height 05/06/20 1045 5\' 5"  (1.651 m)     Head Circumference --      Peak Flow --      Pain Score 05/06/20 1045 5     Pain Loc --      Pain Edu? --      Excl. in GC? --    No data found.  Updated Vital Signs BP (!) 145/110   Pulse 72   Temp 98 F (36.7 C) (Oral)   Resp 17   Ht 5\' 5"  (1.651 m)   Wt 180 lb (81.6 kg)   LMP 04/17/2020   SpO2 98%   BMI 29.95 kg/m   Visual Acuity Right Eye Distance:   Left Eye Distance:   Bilateral Distance:    Right Eye Near:   Left Eye Near:    Bilateral Near:     Physical Exam Vitals and nursing note reviewed.  Constitutional:      General: She is not in acute distress. Cardiovascular:     Pulses: Normal pulses.  Pulmonary:     Effort: Pulmonary effort is normal.  Musculoskeletal:     Right hand: Swelling and tenderness present. No deformity. Decreased range  of motion. Normal sensation.  Normal capillary refill. Normal pulse.     Comments: Right 5th PIP with ecchymosis and swelling and tenderness. No tenderness to remaining R 5th digit or into hand or wrist. Minimal 5th PIP flexion, about 10 degrees, due to pain. Full extension with some discomfort. MCP ROM intact. No deformity/dislocation visible.   Neurological:     Mental Status: She is alert.  Psychiatric:        Mood and Affect: Mood normal.      UC Treatments / Results  Labs (all labs ordered are listed, but only abnormal results are displayed) Labs Reviewed - No data to display  EKG   Radiology DG Hand Complete Right  Result Date: 05/06/2020 CLINICAL DATA:  Right fifth digit injury status post slip and fall EXAM: RIGHT HAND - COMPLETE 3+ VIEW COMPARISON:  None. FINDINGS: Small curvilinear density located along the medial aspect of the base of the distal phalanx of the fifth digit is suspicious for an avulsion type fracture. Soft tissues are unremarkable. IMPRESSION: Small curvilinear density located along the medial aspect of the base of the distal phalanx of the fifth digit is suspicious for an avulsion type fracture. Electronically Signed   By: Acquanetta BellingFarhaan  Mir M.D.   On: 05/06/2020 11:31    Procedures Splint Application  Date/Time: 05/06/2020 11:57 AM Performed by: Estanislado PandyArruda, Yeira Gulden L, PA Authorized by: Estanislado PandyArruda, Elisa Kutner L, PA   Consent:    Consent obtained:  Verbal   Consent given by:  Patient   Risks, benefits, and alternatives were discussed: yes     Risks discussed:  Numbness, pain and discoloration   Alternatives discussed:  Referral Universal protocol:    Procedure explained and questions answered to patient or proxy's satisfaction: yes     Imaging studies available: yes   Pre-procedure details:    Distal neurologic exam:  Normal   Distal perfusion: brisk capillary refill   Procedure details:    Location:  Finger   Finger location:  R small finger   Splint type:  Finger    Supplies:  Aluminum splint   Attestation: Splint applied and adjusted personally by me   Post-procedure details:    Distal neurologic exam:  Normal   Distal perfusion: brisk capillary refill     Procedure completion:  Tolerated   (including critical care time)  Medications Ordered in UC Medications - No data to display  Initial Impression / Assessment and Plan / UC Course  I have reviewed the triage vital signs and the nursing notes.  Pertinent labs & imaging results that were available during my care of the patient were reviewed by me and considered in my medical decision making (see chart for details).     Consistent with PIP dislocation that patient reset. Questionable small avulsion fx of distal phalanx on x-ray but does not correlate clinically. Splint and follow up with sports medicine/ortho if no improvement.   Final Clinical Impressions(s) / UC Diagnoses   Final diagnoses:  Closed dislocation of interphalangeal joint of right little finger  Essential hypertension     Discharge Instructions     Wear splint provided for at least two weeks then gradually resume normal use as tolerated. Take Tylenol as needed for discomfort and apply ice for 20 minute intervals. Follow-up with Sports Medicine if no improvement or worsening.   Blood pressure elevated today, follow-up with PCP for ongoing management. Go to the ER if develop chest pain or difficulty breathing or severe acute headache.     ED Prescriptions  None     PDMP not reviewed this encounter.   Estanislado Pandy, [place] 05/06/20 1210

## 2020-05-06 NOTE — ED Notes (Signed)
Pt walked to xray

## 2020-05-06 NOTE — ED Notes (Signed)
Pt returned to room  

## 2020-05-06 NOTE — Discharge Instructions (Addendum)
Wear splint provided for at least two weeks then gradually resume normal use as tolerated. Take Tylenol as needed for discomfort and apply ice for 20 minute intervals. Follow-up with Sports Medicine if no improvement or worsening.   Blood pressure elevated today, follow-up with PCP for ongoing management. Go to the ER if develop chest pain or difficulty breathing or severe acute headache.

## 2020-07-25 ENCOUNTER — Ambulatory Visit: Payer: Self-pay

## 2021-01-06 ENCOUNTER — Encounter: Payer: Self-pay | Admitting: Emergency Medicine

## 2021-01-06 ENCOUNTER — Emergency Department (INDEPENDENT_AMBULATORY_CARE_PROVIDER_SITE_OTHER): Payer: BC Managed Care – PPO

## 2021-01-06 ENCOUNTER — Emergency Department (INDEPENDENT_AMBULATORY_CARE_PROVIDER_SITE_OTHER)
Admission: EM | Admit: 2021-01-06 | Discharge: 2021-01-06 | Disposition: A | Discharge status: Home / Self Care | Payer: BC Managed Care – PPO | Source: Home / Self Care | Attending: Family Medicine | Admitting: Family Medicine

## 2021-01-06 ENCOUNTER — Other Ambulatory Visit: Payer: Self-pay

## 2021-01-06 DIAGNOSIS — M25531 Pain in right wrist: Secondary | ICD-10-CM | POA: Diagnosis not present

## 2021-01-06 DIAGNOSIS — S63501A Unspecified sprain of right wrist, initial encounter: Secondary | ICD-10-CM | POA: Diagnosis not present

## 2021-01-06 NOTE — Discharge Instructions (Signed)
Wear Ace wrap as long as needed for comfort Use ice for 20 minutes every couple of hours Take Tylenol as needed for pain See your doctor if not improving in a week or 2

## 2021-01-06 NOTE — ED Provider Notes (Signed)
[facility]    CSN: [id number] Arrival date & time: 01/06/21  1010      History   Chief Complaint Chief Complaint  Patient presents with   Wrist Pain    HPI Chloe Liu is a 40 y.o. female.   HPI  Patient fell on outstretched hand.  She has some abrasions in her palm and pain in her wrist.  Limited range of motion.  Here for evaluation.  Past Medical History:  Diagnosis Date   Anxiety    Hypertension    Hypothyroid     There are no problems to display for this patient.   Past Surgical History:  Procedure Laterality Date   ABDOMINAL SURGERY     CESAREAN SECTION     2   CHOLECYSTECTOMY     DILATION AND CURETTAGE OF UTERUS      OB History     Gravida  1   Para      Term      Preterm      AB      Living         SAB      IAB      Ectopic      Multiple      Live Births               Home Medications    Prior to Admission medications   Medication Sig Start Date End Date Taking? Authorizing Provider  albuterol (PROVENTIL HFA;VENTOLIN HFA) 108 (90 BASE) MCG/ACT inhaler Inhale 1-2 puffs into the lungs every 6 (six) hours as needed for wheezing or shortness of breath. 10/12/13   Marlaine Hind, MD  albuterol (PROVENTIL HFA;VENTOLIN HFA) 108 (90 Base) MCG/ACT inhaler Inhale 1-2 puffs into the lungs every 6 (six) hours as needed for wheezing or shortness of breath. 02/07/15   Lurene Shadow, PA-C  ALPRAZolam Prudy Feeler) 0.5 MG tablet Take 0.5 mg by mouth once. 02/29/20   [provider]  Calcium Carbonate-Vit D-Min (CALCIUM 1200) 1200-1000 MG-UNIT CHEW Chew by mouth.    [provider]  furosemide (LASIX) 20 MG tablet Take by mouth. 02/29/20 02/28/21  [provider]  hydrochlorothiazide (HYDRODIURIL) 25 MG tablet Take 25 mg by mouth daily.    [provider]  levothyroxine (SYNTHROID, LEVOTHROID) 50 MCG tablet Take 50 mcg by mouth daily.    [provider]  losartan (COZAAR) 25 MG tablet 50  mg.    [provider]  metoprolol succinate (TOPROL-XL) 50 MG 24 hr tablet Take 75 mg by mouth daily. Take with or immediately following a meal.    [provider]  Multiple Vitamin (MULTI-VITAMIN) tablet Take 1 tablet by mouth daily.    [provider]  Naltrexone-Bupropion HCl (CONTRAVE PO) Take by mouth.    [provider]  phentermine (ADIPEX-P) 37.5 MG tablet phentermine 37.5 mg tablet  TAKE ONE TABLET (37.5 MG DOSE) BY MOUTH 30 (THIRTY) MINUTES BEFORE BREAKFAST FOR 30 DAYS. 07/16/13   [provider]  SUMAtriptan (IMITREX) 50 MG tablet Take by mouth. 04/29/20   [provider]  traMADol (ULTRAM) 50 MG tablet Take 1 tablet (50 mg total) by mouth every 6 (six) hours as needed. 01/08/16   Lurene Shadow, PA-C  venlafaxine XR (EFFEXOR-XR) 37.5 MG 24 hr capsule Take by mouth. 04/29/20   [provider]    Family History Family History  Problem Relation Age of Onset   Multiple sclerosis Mother    Depression  Mother    Hypertension Father    Cancer Father     Social History Social History   Tobacco Use   Smoking status: Never   Smokeless tobacco: Never  Substance Use Topics   Alcohol use: No   Drug use: No     Allergies   Relpax [eletriptan] and Sulfa antibiotics   Review of Systems Review of Systems See HPI  Physical Exam Triage Vital Signs ED Triage Vitals  Enc Vitals Group     BP 01/06/21 1043 (!) 157/114     Pulse Rate 01/06/21 1043 84     Resp 01/06/21 1043 18     Temp 01/06/21 1043 98.3 F (36.8 C)     Temp Source 01/06/21 1043 Oral     SpO2 01/06/21 1043 100 %     Weight 01/06/21 1046 184 lb (83.5 kg)     Height --      Head Circumference --      Peak Flow --      Pain Score 01/06/21 1046 4     Pain Loc --      Pain Edu? --      Excl. in GC? --    No data found.  Updated Vital Signs BP (!) 157/114 (BP Location: Right Arm)   Pulse 84   Temp 98.3 F (36.8 C) (Oral)   Resp 18   Wt 83.5 kg    LMP 12/20/2020   SpO2 100%   BMI 30.62 kg/m      Physical Exam Constitutional:      General: She is not in acute distress.    Appearance: She is well-developed and normal weight.  HENT:     Head: Normocephalic and atraumatic.     Mouth/Throat:     Comments: Mask in place Eyes:     Conjunctiva/sclera: Conjunctivae normal.     Pupils: Pupils are equal, round, and reactive to light.  Cardiovascular:     Rate and Rhythm: Normal rate.  Pulmonary:     Effort: Pulmonary effort is normal. No respiratory distress.  Abdominal:     General: There is no distension.     Palpations: Abdomen is soft.  Musculoskeletal:        General: Normal range of motion.     Cervical back: Normal range of motion.     Comments: Right wrist appears normal.  There are scant abrasions on the palm proximally.  There is tenderness over the anterior carpal region and towards the ulnar styloid region.  Patient lacks flexion extension due to pain.  Movement of fingers is normal.  Patient has some swelling around her small finger PIP joint that is chronic.  Sensory exam is normal  Skin:    General: Skin is warm and dry.  Neurological:     Mental Status: She is alert.  Psychiatric:        Mood and Affect: Mood normal.        Behavior: Behavior normal.     UC Treatments / Results  Labs (all labs ordered are listed, but only abnormal results are displayed) Labs Reviewed - No data to display  EKG   Radiology DG Wrist Complete Right  Result Date: 01/06/2021 CLINICAL DATA:  Trauma, pain EXAM: RIGHT WRIST - COMPLETE 3+ VIEW COMPARISON:  Radiographs of right hand done on 05/06/2020 FINDINGS: There is no evidence of fracture or dislocation. There is no evidence of arthropathy or other focal bone abnormality. Soft tissues are unremarkable. IMPRESSION: No radiographic abnormality  is seen in the right wrist. Electronically Signed   By: Ernie Avena M.D.   On: 01/06/2021 11:05    Procedures Procedures  (including critical care time)  Medications Ordered in UC Medications - No data to display  Initial Impression / Assessment and Plan / UC Course  I have reviewed the triage vital signs and the nursing notes.  Pertinent labs & imaging results that were available during my care of the patient were reviewed by me and considered in my medical decision making (see chart for details).     Final Clinical Impressions(s) / UC Diagnoses   Final diagnoses:  Sprain of right wrist, initial encounter     Discharge Instructions      Wear Ace wrap as long as needed for comfort Use ice for 20 minutes every couple of hours Take Tylenol as needed for pain See your doctor if not improving in a week or 2     ED Prescriptions   None    PDMP not reviewed this encounter.   Eustace Moore, MD 01/06/21 [phone]

## 2021-01-06 NOTE — ED Triage Notes (Signed)
Pt states she tripped yesterday and caught herself with right hand. C/o right wrist pain that is worse today.

## 2022-12-16 IMAGING — DX DG WRIST COMPLETE 3+V*R*
4 series · 4 of 4 positions shown · non-contrast
Comparison: Radiographs of right hand done on 05/06/2020

CLINICAL DATA: Trauma, pain

EXAM:
RIGHT WRIST - COMPLETE 3+ VIEW

[wrist pa]
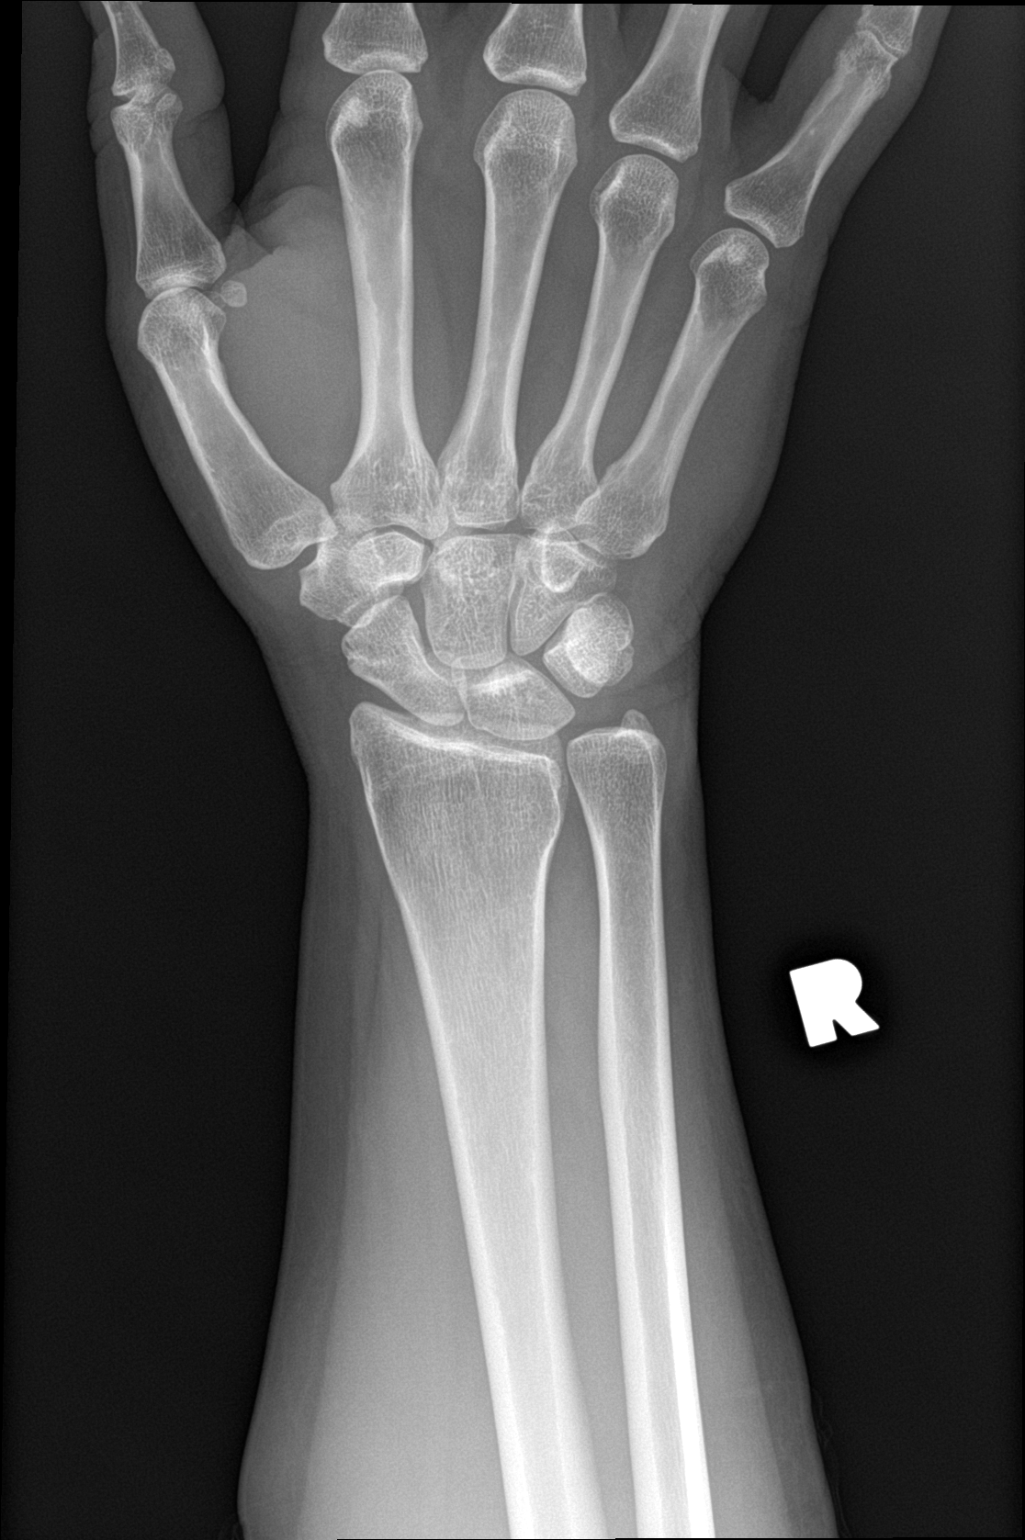

[wrist obl]
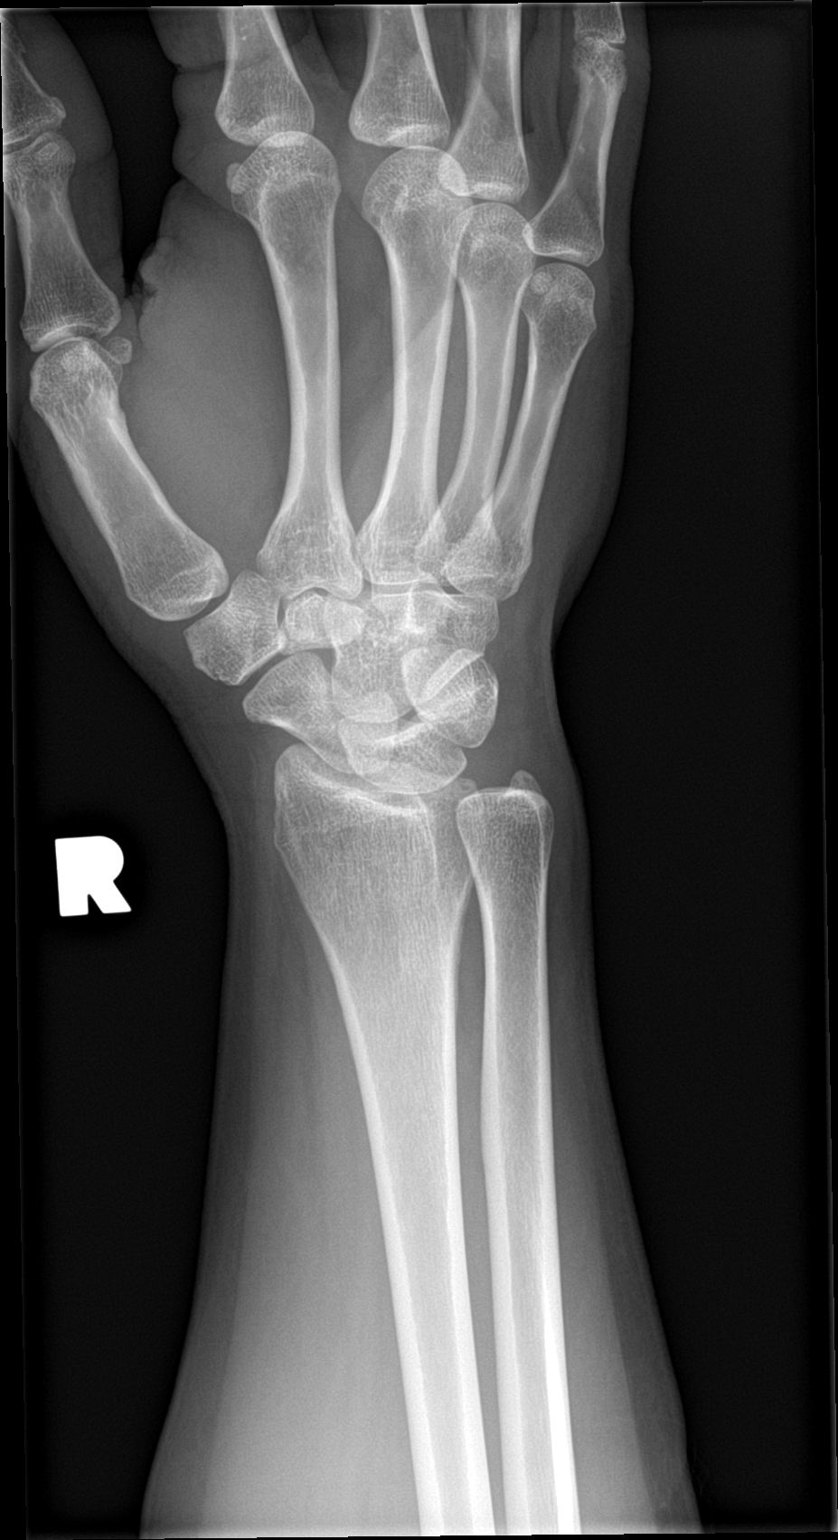

[wrist lat]
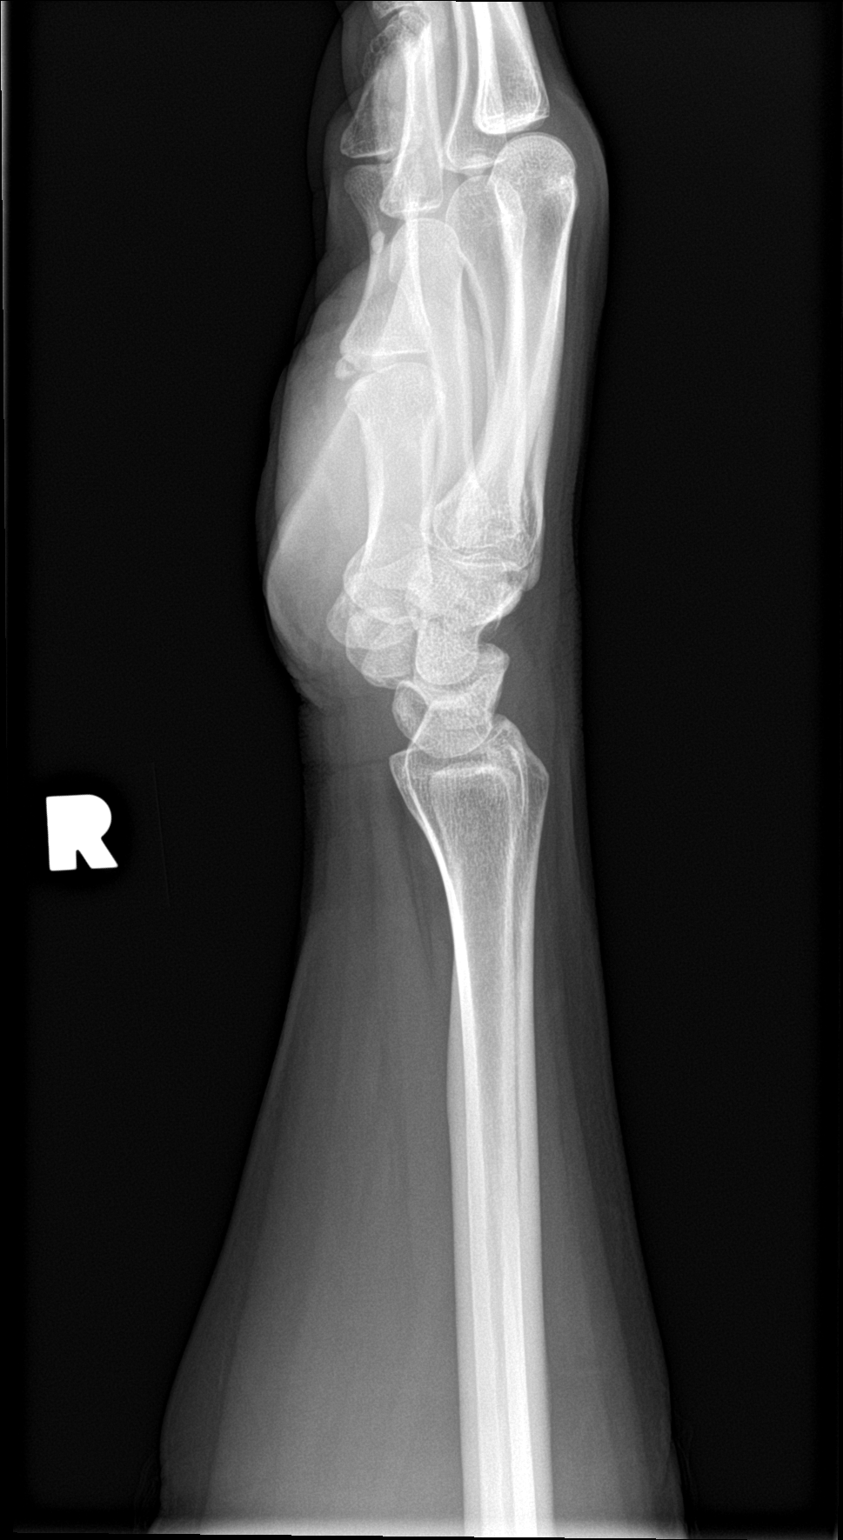

[wrist navicular]
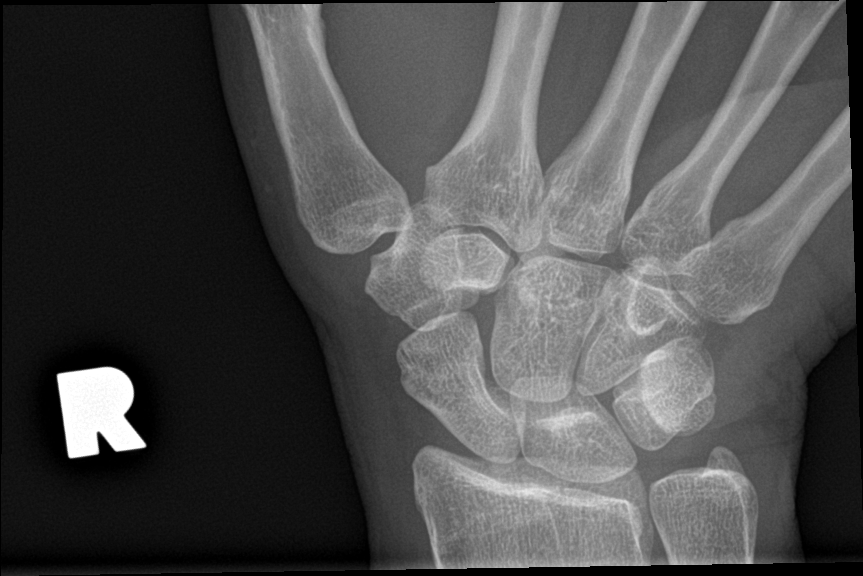

[4 of 4 positions shown; findings below may reference images not displayed]

FINDINGS: There is no evidence of fracture or dislocation. There is no
evidence of arthropathy or other focal bone abnormality. Soft
tissues are unremarkable.
IMPRESSION: No radiographic abnormality is seen in the right wrist.
